# Patient Record
Sex: Female | Born: 1975 | Race: White | Hispanic: No | Marital: Married | Smoking: Current every day smoker
Health system: Southern US, Community
[De-identification: ages and names within clinical notes are randomized; demographics above are authoritative.]

---

## 1998-03-17 ENCOUNTER — Other Ambulatory Visit: Admission: RE | Admit: 1998-03-17 | Discharge: 1998-03-17 | Payer: Self-pay | Admitting: Obstetrics and Gynecology

## 1999-04-11 ENCOUNTER — Other Ambulatory Visit: Admission: RE | Admit: 1999-04-11 | Discharge: 1999-04-11 | Payer: Self-pay | Admitting: Obstetrics & Gynecology

## 2000-05-24 ENCOUNTER — Other Ambulatory Visit: Admission: RE | Admit: 2000-05-24 | Discharge: 2000-05-24 | Payer: Self-pay | Admitting: Obstetrics & Gynecology

## 2001-06-04 ENCOUNTER — Other Ambulatory Visit: Admission: RE | Admit: 2001-06-04 | Discharge: 2001-06-04 | Payer: Self-pay | Admitting: Obstetrics & Gynecology

## 2002-06-19 ENCOUNTER — Other Ambulatory Visit: Admission: RE | Admit: 2002-06-19 | Discharge: 2002-06-19 | Payer: Self-pay | Admitting: Obstetrics & Gynecology

## 2003-05-03 ENCOUNTER — Other Ambulatory Visit: Admission: RE | Admit: 2003-05-03 | Discharge: 2003-05-03 | Payer: Self-pay | Admitting: Obstetrics & Gynecology

## 2004-10-31 ENCOUNTER — Ambulatory Visit: Payer: Self-pay | Admitting: Obstetrics & Gynecology

## 2004-11-28 ENCOUNTER — Encounter (INDEPENDENT_AMBULATORY_CARE_PROVIDER_SITE_OTHER): Payer: Self-pay | Admitting: *Deleted

## 2004-11-28 ENCOUNTER — Ambulatory Visit: Payer: Self-pay | Admitting: Obstetrics & Gynecology

## 2004-11-28 ENCOUNTER — Other Ambulatory Visit: Admission: RE | Admit: 2004-11-28 | Discharge: 2004-11-28 | Payer: Self-pay | Admitting: Obstetrics & Gynecology

## 2004-12-12 ENCOUNTER — Ambulatory Visit: Payer: Self-pay | Admitting: Obstetrics & Gynecology

## 2004-12-25 ENCOUNTER — Ambulatory Visit: Payer: Self-pay | Admitting: Obstetrics & Gynecology

## 2004-12-25 ENCOUNTER — Observation Stay (HOSPITAL_COMMUNITY): Admission: AD | Admit: 2004-12-25 | Discharge: 2004-12-26 | Payer: Self-pay | Admitting: Obstetrics & Gynecology

## 2004-12-25 ENCOUNTER — Encounter (INDEPENDENT_AMBULATORY_CARE_PROVIDER_SITE_OTHER): Payer: Self-pay | Admitting: Specialist

## 2005-01-03 ENCOUNTER — Observation Stay (HOSPITAL_COMMUNITY): Admission: AD | Admit: 2005-01-03 | Discharge: 2005-01-04 | Payer: Self-pay | Admitting: Obstetrics and Gynecology

## 2005-01-09 ENCOUNTER — Ambulatory Visit: Payer: Self-pay | Admitting: Obstetrics & Gynecology

## 2005-05-15 ENCOUNTER — Ambulatory Visit: Payer: Self-pay | Admitting: Obstetrics and Gynecology

## 2005-09-19 ENCOUNTER — Ambulatory Visit: Payer: Self-pay | Admitting: Obstetrics & Gynecology

## 2005-10-17 ENCOUNTER — Ambulatory Visit: Payer: Self-pay | Admitting: Obstetrics & Gynecology

## 2005-12-28 ENCOUNTER — Ambulatory Visit: Payer: Self-pay | Admitting: Gynecology

## 2005-12-28 ENCOUNTER — Encounter (INDEPENDENT_AMBULATORY_CARE_PROVIDER_SITE_OTHER): Payer: Self-pay | Admitting: Gynecology

## 2006-03-21 ENCOUNTER — Ambulatory Visit: Payer: Self-pay | Admitting: Obstetrics and Gynecology

## 2006-03-21 ENCOUNTER — Encounter (INDEPENDENT_AMBULATORY_CARE_PROVIDER_SITE_OTHER): Payer: Self-pay | Admitting: Gynecology

## 2006-09-04 ENCOUNTER — Other Ambulatory Visit: Admission: RE | Admit: 2006-09-04 | Discharge: 2006-09-04 | Payer: Self-pay | Admitting: Obstetrics & Gynecology

## 2006-09-04 ENCOUNTER — Encounter: Payer: Self-pay | Admitting: Obstetrics & Gynecology

## 2006-09-04 ENCOUNTER — Ambulatory Visit: Payer: Self-pay | Admitting: Obstetrics & Gynecology

## 2006-09-05 ENCOUNTER — Encounter: Payer: Self-pay | Admitting: Obstetrics & Gynecology

## 2006-09-18 ENCOUNTER — Ambulatory Visit: Payer: Self-pay | Admitting: Obstetrics & Gynecology

## 2006-12-18 ENCOUNTER — Ambulatory Visit: Payer: Self-pay | Admitting: *Deleted

## 2007-03-20 ENCOUNTER — Ambulatory Visit: Payer: Self-pay | Admitting: Obstetrics & Gynecology

## 2007-03-20 ENCOUNTER — Encounter: Payer: Self-pay | Admitting: Obstetrics and Gynecology

## 2007-03-20 ENCOUNTER — Other Ambulatory Visit: Admission: RE | Admit: 2007-03-20 | Discharge: 2007-03-20 | Payer: Self-pay | Admitting: Obstetrics and Gynecology

## 2008-03-24 ENCOUNTER — Ambulatory Visit: Payer: Self-pay | Admitting: Obstetrics & Gynecology

## 2008-03-24 ENCOUNTER — Encounter: Payer: Self-pay | Admitting: Obstetrics & Gynecology

## 2009-03-25 ENCOUNTER — Ambulatory Visit: Payer: Self-pay | Admitting: Obstetrics & Gynecology

## 2010-08-22 NOTE — Group Therapy Note (Signed)
NAME:  Mackenzie Bowers, Mackenzie Bowers NO.:  [id number]   MEDICAL RECORD NO.:  [id number]          PATIENT TYPE:  WOC   LOCATION:  [facility]                   FACILITY:  [facility]   PHYSICIAN:  Dorthula Perfect, MD     DATE OF BIRTH:  1976/01/26   DATE OF SERVICE:  03/24/2008                                  CLINIC NOTE   REASON FOR VISIT:  Annual exam and Pap smear.   VITAL SIGNS:  Temperature 98.1, pulse 82, blood pressure 133/76, weight  121.1 pounds, height 64 inches.   PAST MEDICAL HISTORY:  Patient had a history of adenocarcinoma in situ  in 2006.  She had a cold conization of the cervix on December 25, 2004,  and her most recent Pap smear 1 year ago in December 2008 was normal.  Of note, during her annual exam last year, on speculum exam, the  examiner observed some polypoidal tissue, so a biopsy was taken, and  this showed no malignancy.   PHYSICAL EXAMINATION:  GENERAL:  The patient is well nourished, well  developed, in no acute distress.  CARDIOVASCULAR:  She had regular rate and rhythm, no murmurs, rubs, or  gallops.  LUNGS:  Clear to auscultation bilaterally.  ABDOMEN:  Normoactive bowel sounds.  Her abdomen is soft, nontender,  nondistended.  She has no edema.  GYNECOLOGIC:  On the speculum exam, there is a reddened area that  appears to be in the endocervical canal.  I examined this patient with  Dr. Perlie Gold.  There seems to be columnar epithelium.  This is visible in  the canal of the cervix.  Other than that, there were no lesions or  masses.  Bimanual exam was normal with normal-sized uterus and ovaries  with no masses or tenderness.   Pap smear was sent, and patient was told to follow up in 1 year.     ______________________________  Darl Pikes Dr. Lafonda Mosses    ______________________________  Dorthula Perfect, MD    SD/MEDQ  D:  03/24/2008  T:  03/24/2008  Job:  [id number]

## 2010-08-25 NOTE — Group Therapy Note (Signed)
NAME:  Mackenzie Bowers, Mackenzie Bowers NO.:  [id number]   MEDICAL RECORD NO.:  [id number]          PATIENT TYPE:  WOC   LOCATION:  [facility]                   FACILITY:  [facility]   PHYSICIAN:  Elsie Lincoln, MD      DATE OF BIRTH:  July 30, 1975   DATE OF SERVICE:  12/12/2004                                    CLINIC NOTE   REASON FOR VISIT:  The patient presents for results and evaluation of  continued bleeding of the cervix. It was noted that the patient had a small  amount of bleeding at 12 o'clock, not from the cervical os but the portio.  This was swabbed with Fox swabs and then silver nitrate was placed. There  was good hemostasis at the end of the exam. The patient was explained that  she has adenocarcinoma in situ on cervical biopsy and that a cold knife cone  would be needed in order to see if there was microinvasion. The patient does  desire future children. However, she is currently not wanting to have them  with her partner that she has now. He is being unfaithful and she knows that  this is a problem. I explained to her that we could have a meeting with the  oncologist after the cold knife cone results are back to come up with the  possibility of future fertility. Will schedule cold knife cone.           ______________________________  Elsie Lincoln, MD     KL/MEDQ  D:  12/12/2004  T:  12/12/2004  Job:  [id number]

## 2010-08-25 NOTE — Op Note (Signed)
NAME:  Mackenzie Bowers, Mackenzie Bowers NO.:  [id number]   MEDICAL RECORD NO.:  [id number]          PATIENT TYPE:  OBV   LOCATION:  9307                          [facility]   PHYSICIAN:  Lesly Dukes, M.D. DATE OF BIRTH:  [birth year]   DATE OF PROCEDURE:  12/25/2004  DATE OF DISCHARGE:                                 OPERATIVE REPORT   PREOPERATIVE DIAGNOSIS:  Adenocarcinoma in situ of cervix.   POSTOPERATIVE DIAGNOSIS:  Adenocarcinoma in situ of cervix.   OPERATION/PROCEDURE:  Cold knife conization of the cervix.   SURGEON:  Lesly Dukes, M.D.  Debbrah Alar, M.D.   ANESTHESIA:  General.   SPECIMENS:  Cervical cone biopsy.   ESTIMATED BLOOD LOSS:  200 mL.   COMPLICATIONS:  None.   DESCRIPTION OF PROCEDURE:  After informed consent was obtained, she was  taken to the operating room where general anesthesia was induced.  The  patient was placed in the dorsal lithotomy position.  The cervix was cleaned  with acetic acid.  Care was given that the patient wants the option for  future childbearing.  We wanted this cone to be both diagnostic and  curative.  Retractors were placed into the vagina and the cervix was grasped  with a single-tooth tenaculum. Angle sutures were placed at 3 and 9 o'clock  of 0 Vicryl.  Cervical transformation zone was excised with a scalpel in a  circumferential fashion.  Hemostasis was achieved with 0 Vicryl suture in a  McDonald circumferential fashion.  Two of these sutures were required in  order to achieve hemostasis.  The cervical os was patent at the end of the  procedure.  The patient tolerated the procedure well.  Sponge, lap and  instrument counts were correct x2.  There is no bleeding at the end of the  procedure.  The patient went to the recovery room in stable condition.           ______________________________  Lesly Dukes, M.D.     KHL/MEDQ  D:  12/25/2004  T:  12/26/2004  Job:  [id number]

## 2010-08-25 NOTE — Group Therapy Note (Signed)
NAME:  Mackenzie Bowers, Mackenzie Bowers NO.:  [id number]   MEDICAL RECORD NO.:  [id number]          PATIENT TYPE:  WOC   LOCATION:  [facility]                   [facility]ACILITY:  [facility]   PHYSICIAN:  Elsie Lincoln, MD      DATE OF BIRTH:  1976/01/22   DATE OF SERVICE:  01/09/2005                                    CLINIC NOTE   Patient is a 35 year old female who underwent cold knife cone two weeks ago  for adenocarcinoma in situ.  Patient had two episodes of bleeding, one  postoperative day 0 and one last Wednesday.  Postoperative day 0 bleeding  was managed with intrahospital observation with no more bleeding overnight.  The patient then came back in on September 27 bleeding and she was packed  with Avitene and Kerlix.  The bleeding subsequently stopped.  The patient  has only had scant spotting since.  Of note, the cervical pathology for the  cone was high-grade squamous intraepithelial lesion II-III, endocervical  adenocarcinoma in situ.  Today the cervix shows granulation tissue and no  bleeding.  The patient is to come back in four months for Pap smears for a  year and then go to six months for year two after that.  Patient warned not  to get pregnant and she is going to continue taking her Ortho Tri-Cyclen Lo.           ______________________________  Elsie Lincoln, MD     KL/MEDQ  D:  01/09/2005  T:  01/10/2005  Job:  [id number]

## 2010-08-25 NOTE — Group Therapy Note (Signed)
NAME:  Mackenzie Bowers, Mackenzie Bowers NO.:  [id number]   MEDICAL RECORD NO.:  [id number]          PATIENT TYPE:  WOC   LOCATION:  [facility]                   [facility]ACILITY:  [facility]   PHYSICIAN:  Elsie Lincoln, MD      DATE OF BIRTH:  [birth year]   DATE OF SERVICE:  09/19/2005                                    CLINIC NOTE   HISTORY OF PRESENT ILLNESS:  The patient is a 35 year old female with  complaints of dysuria.  She came here today actually for Pap smear status  post LEEP for adenocarcinoma in situ.  The patient also is undergoing a  divorce.  Her husband committed adultery.  She is sexually active with a new  person, and uses condoms in combination with oral contraceptives for  contraception.   PHYSICAL EXAMINATION:  GENITOURINARY:  Tanner 5.  Vagina pink.  Normal  rugae.  Cervix short, and evidence of granulation tissue at the os.   ASSESSMENT:  A 35 year old female with urinary tract infection and  adenocarcinoma in situ.   PLAN:  1.  UA and urine culture sent.  2.  Treat with Cipro empirically, 500 mg p.o. b.i.d. for 3 days.  3.  Pap smear, GC and chlamydia sent today.  4.  Patient again counseled for hysterectomy as soon as childbearing is      finished.  If she does have an abnormal Pap smear, I will go ahead and      recommended hysterectomy again.  5.  Patient is to come back in 4 months for a Pap smear.           ______________________________  Elsie Lincoln, MD     KL/MEDQ  D:  09/19/2005  T:  09/19/2005  Job:  [id number]

## 2010-08-25 NOTE — Op Note (Signed)
NAME:  Mackenzie Bowers, Mackenzie Bowers                 ACCOUNT NO.:  [id number]   MEDICAL RECORD NO.:  [id number]          PATIENT TYPE:  OBV   LOCATION:  9303                          [facility]   PHYSICIAN:  Phil D. Okey Dupre, M.D.     DATE OF BIRTH:  1975/05/07   DATE OF PROCEDURE:  01/03/2005  DATE OF DISCHARGE:                                 OPERATIVE REPORT   The patient is a 35 year old white female who 1 week ago underwent cold  conization of the cervix for severe dysplasia with some evidence of  adenocarcinoma. Came in because of heavy bleeding. She had just gotten on a  week when she would normally have her period, but the bleeding was much  heavier than that. On examination there was a large clot in the cervix. The  operative site was clearly seen and there was a constant trickle of bright  red blood from the site. No pumping action could be seen. The area was  cleaned and pressure tried with Monsel's solution. This did not work so as  an alternative I once again put pressure on it with Monsel's solution until  the area was dry, packed the area with Avitene followed by Gelfoam and held  significant pressure on that for 5 minutes. When I removed the pressure,  there seemed to be no bleeding through. So I took three bottles of plain  packing tied them together and packed the vagina. A Foley catheter was  placed in. I discussed with the patient and told her I would rather keep her  overnight and observe her and then have the pack removed in the morning and  see if that controlled the bleeding completely. If it did, she could  probably go home later in the day. If not we could make her n.p.o. after  midnight and she would be ready to go back to the OR to have resuturing  done. The pathology showed severe cervical dysplasia with adenocarcinoma in  situ, but all margins were free of tumor.           ______________________________  Javier Glazier. Okey Dupre, M.D.     PDR/MEDQ  D:  01/03/2005  T:  01/04/2005   Job:  [id number]

## 2010-08-25 NOTE — Group Therapy Note (Signed)
NAME:  Mackenzie Bowers, Mackenzie Bowers NO.:  [id number]   MEDICAL RECORD NO.:  [id number]          PATIENT TYPE:  WOC   LOCATION:  [facility]                   [facility]ACILITY:  [facility]   PHYSICIAN:  Ginger Carne, MD DATE OF BIRTH:  07-17-1975   DATE OF SERVICE:  12/28/2005                                    CLINIC NOTE   REASON FOR CONSULTATION:  Follow-up from adenocarcinoma in situ to the  cervix, LEEP procedure in September 2006 and bilateral pelvic pain.   HISTORY OF PRESENT ILLNESS:  This patient is a 35 year old Caucasian female  who underwent a LEEP procedure in September 2006 for adenocarcinoma in situ  of the cervix.  She has done well apart from minimal bilateral pelvic pain  which the patient states is resolving.   SALIENT PHYSICAL FINDINGS:  VITAL SIGNS:  Blood pressure is 137/84, weight  118 pounds, pulse 95.  ABDOMEN:  Soft without gross hepatosplenomegaly.  No tenderness noted.  PELVIC:  Pap smear performed.  Uterus small, anteverted and flexed without  tenderness.  Both adnexa palpable without masses or tenderness.  Cervix  smooth without erosions or lesions.   IMPRESSION:  Follow up adenocarcinoma in situ of the cervix and resolving  pelvic pain.   PLAN:  The patient was advised to return in 6 months for follow-up Pap  smear. I explained to her that typically we would 4-6 cycles of Pap smears  following her LEEP procedure in September 2006.  After the fall of 2008, she  can return to yearly Pap smears.  The patient's discomfort is resolving in  her pelvis at this time.  It does not seem necessary to pursue an ultrasound  or any further diagnostic evaluation.  Patient is agreement with same.           ______________________________  Ginger Carne, MD     SHB/MEDQ  D:  12/28/2005  T:  12/31/2005  Job:  [id number]

## 2010-08-25 NOTE — Group Therapy Note (Signed)
NAME:  Mackenzie Bowers, Mackenzie Bowers NO.:  [id number]   MEDICAL RECORD NO.:  [id number]          PATIENT TYPE:  WOC   LOCATION:  [facility]                   [facility]ACILITY:  [facility]   PHYSICIAN:  Argentina Donovan, MD        DATE OF BIRTH:  04-03-1976   DATE OF SERVICE:                                    CLINIC NOTE   The patient is a 35 year old white female gravida 1, para 1-0-0-1, who  underwent cold knife conization of the cervix in September of 2006 for  adenocarcinoma in situ.  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  The patient postop was seen in the MAU because of heavy bleeding,  which was controlled conservatively and she has been fine ever since.  She  is in today for her follow up Pap smear, which was done without incident.   EXAMINATION:  The cervix was clean with polypoid protrusions from the  endocervix that were very friable.  We are awaiting the results of the  smear.  Meanwhile, the patient's blood pressure was somewhat high for her  age and weight, and a repeat blood pressure was 154/85.  We will follow that  up at the next visit.   IMPRESSION:  Post conization adenocarcinoma in situ of the endocervix.           ______________________________  Argentina Donovan, MD     PR/MEDQ  D:  05/15/2005  T:  05/15/2005  Job:  [id number]

## 2014-02-03 ENCOUNTER — Emergency Department (HOSPITAL_COMMUNITY)
Admission: EM | Admit: 2014-02-03 | Discharge: 2014-02-03 | Disposition: A | Discharge status: Home / Self Care | Payer: No Typology Code available for payment source | Attending: Emergency Medicine | Admitting: Emergency Medicine

## 2014-02-03 ENCOUNTER — Emergency Department (HOSPITAL_COMMUNITY): Payer: No Typology Code available for payment source

## 2014-02-03 ENCOUNTER — Encounter (HOSPITAL_COMMUNITY): Payer: Self-pay | Admitting: Emergency Medicine

## 2014-02-03 DIAGNOSIS — S92351A Displaced fracture of fifth metatarsal bone, right foot, initial encounter for closed fracture: Secondary | ICD-10-CM | POA: Insufficient documentation

## 2014-02-03 DIAGNOSIS — S99911A Unspecified injury of right ankle, initial encounter: Secondary | ICD-10-CM | POA: Insufficient documentation

## 2014-02-03 DIAGNOSIS — Y9389 Activity, other specified: Secondary | ICD-10-CM | POA: Insufficient documentation

## 2014-02-03 DIAGNOSIS — Z880 Allergy status to penicillin: Secondary | ICD-10-CM | POA: Diagnosis not present

## 2014-02-03 DIAGNOSIS — S92301A Fracture of unspecified metatarsal bone(s), right foot, initial encounter for closed fracture: Secondary | ICD-10-CM

## 2014-02-03 DIAGNOSIS — M25571 Pain in right ankle and joints of right foot: Secondary | ICD-10-CM

## 2014-02-03 DIAGNOSIS — Y92481 Parking lot as the place of occurrence of the external cause: Secondary | ICD-10-CM | POA: Diagnosis not present

## 2014-02-03 DIAGNOSIS — Z720 Tobacco use: Secondary | ICD-10-CM | POA: Insufficient documentation

## 2014-02-03 DIAGNOSIS — M25471 Effusion, right ankle: Secondary | ICD-10-CM

## 2014-02-03 MED ORDER — NAPROXEN 500 MG PO TABS: 500.0000 mg | ORAL_TABLET | Freq: Two times a day (BID) | ORAL | Status: AC

## 2014-02-03 MED ORDER — HYDROCODONE-ACETAMINOPHEN 5-325 MG PO TABS: 1.0000 | ORAL_TABLET | Freq: Four times a day (QID) | ORAL | Status: AC | PRN

## 2014-02-03 MED ORDER — OXYCODONE-ACETAMINOPHEN 5-325 MG PO TABS
2.0000 | ORAL_TABLET | Freq: Once | ORAL | Status: AC
  Administered 2014-02-03: 2 via ORAL
  Filled 2014-02-03: qty 2

## 2014-02-03 MED ORDER — ONDANSETRON 4 MG PO TBDP
4.0000 mg | ORAL_TABLET | Freq: Once | ORAL | Status: AC
  Administered 2014-02-03: 4 mg via ORAL
  Filled 2014-02-03: qty 1
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
## 2014-02-03 NOTE — ED Provider Notes (Signed)
Medical screening examination/treatment/procedure(s) were performed by non-physician practitioner and as supervising physician I was immediately available for consultation/collaboration.   EKG Interpretation None        Layla MawKristen N Money Mckeithan, DO 02/03/14 1612
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
